# Patient Record
Sex: Female | Born: 1995 | Race: Black or African American | Hispanic: No | Marital: Single | Smoking: Former smoker
Health system: Southern US, Community
[De-identification: ages and names within clinical notes are randomized; demographics above are authoritative.]

---
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
## 2015-08-06 ENCOUNTER — Ambulatory Visit (INDEPENDENT_AMBULATORY_CARE_PROVIDER_SITE_OTHER): Payer: 59 | Admitting: Physician Assistant

## 2015-08-06 VITALS — BP 122/80 | HR 84 | Temp 98.2°F | Resp 16 | Ht 66.0 in | Wt 194.2 lb

## 2015-08-06 DIAGNOSIS — Z111 Encounter for screening for respiratory tuberculosis: Secondary | ICD-10-CM

## 2015-08-06 DIAGNOSIS — Z029 Encounter for administrative examinations, unspecified: Secondary | ICD-10-CM | POA: Diagnosis not present

## 2015-08-06 NOTE — Patient Instructions (Signed)
     IF you received an x-ray today, you will receive an invoice from [facility]. Please contact [facility] at [phone] with questions or concerns regarding your invoice.   IF you received labwork today, you will receive an invoice from [facility]. Please contact [facility] at [phone] with questions or concerns regarding your invoice.   Our billing staff will not be able to assist you with questions regarding bills from these companies.  You will be contacted with the lab results as soon as they are available. The fastest way to get your results is to activate your My Chart account. Instructions are located on the last page of this paperwork. If you have not heard from us regarding the results in 2 weeks, please contact this office.      

## 2015-08-06 NOTE — Progress Notes (Signed)

## 2015-08-06 NOTE — Progress Notes (Signed)
[facility] [address], [place] [address] [phone]  Date:  08/06/2015   Name:  Sara SimmeringDaeja A Waller   DOB:  12-May-1996   MRN:  [id number]  PCP:  No primary care provider on file.    Chief Complaint: Annual Exam   History of Present Illness:  This is a 20 y.o. female who is presenting to have adoption form filled out. She lives with her mother and grandmother who do foster care and are about to adopt one of the foster kids. 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 She has no significant PMH. No physical limitations. No mental health issues.  Needs to get ppd today. She has had before and never positive.  Review of Systems:  Review of Systems See HPI  There are no active problems to display for this patient.   Prior to Admission medications   Not on File    No Known Allergies  History reviewed. No pertinent past surgical history.  Social History  Substance Use Topics  . Smoking status: Former Games developermoker  . Smokeless tobacco: None  . Alcohol Use: None    No family history on file.  Medication list has been reviewed and updated.  Physical Examination:  Physical Exam  Constitutional: She is oriented to person, place, and time. She appears well-developed and well-nourished. No distress.  HENT:  Head: Normocephalic and atraumatic.  Right Ear: Hearing, tympanic membrane, external ear and ear canal normal.  Left Ear: Hearing, tympanic membrane, external ear and ear canal normal.  Nose: Nose normal.  Mouth/Throat: Uvula is midline, oropharynx is clear and moist and mucous membranes are normal.  Eyes: Conjunctivae and lids are normal. Right eye exhibits no discharge. Left eye exhibits no discharge. No scleral icterus.  Cardiovascular: Normal rate, regular rhythm, normal heart sounds and normal pulses.   No murmur heard. Pulmonary/Chest: Effort normal and breath sounds normal. No respiratory distress.  She has no wheezes. She has no rhonchi. She has no rales.  Musculoskeletal: Normal range of motion.  Lymphadenopathy:       Head (right side): No submental, no submandibular and no tonsillar adenopathy present.       Head (left side): No submental, no submandibular and no tonsillar adenopathy present.    She has no cervical adenopathy.  Neurological: She is alert and oriented to person, place, and time.  Skin: Skin is warm, dry and intact. No lesion and no rash noted.  Psychiatric: She has a normal mood and affect. Her speech is normal and behavior is normal. Thought content normal.   BP 122/80 mmHg  Pulse 84  Temp(Src) 98.2 F (36.8 C) (Oral)  Resp 16  Ht 5\' 6"  (1.676 m)  Wt 194 lb 3.2 oz (88.089 kg)  BMI 31.36 kg/m2  LMP 08/01/2015 (Approximate)  Assessment and Plan:  1. Encounter for administrative examinations 2. Screening-pulmonary TB Cleared from a medical standpoint. Forms pilled out. PPD placed. Return in 48-72 hours to have read. - TB Skin Test   Roswell MinersNicole V. Dyke BrackettBush, PA-C, MHS [facility]  08/06/2015

## 2015-08-09 ENCOUNTER — Ambulatory Visit (INDEPENDENT_AMBULATORY_CARE_PROVIDER_SITE_OTHER): Payer: 59 | Admitting: *Deleted

## 2015-08-09 DIAGNOSIS — Z111 Encounter for screening for respiratory tuberculosis: Secondary | ICD-10-CM

## 2015-08-09 LAB — TB SKIN TEST
Induration: 0 mm
TB Skin Test: NEGATIVE

## 2016-01-24 ENCOUNTER — Ambulatory Visit (INDEPENDENT_AMBULATORY_CARE_PROVIDER_SITE_OTHER): Payer: 59 | Admitting: Family Medicine

## 2016-01-24 ENCOUNTER — Other Ambulatory Visit: Payer: Self-pay | Admitting: Family Medicine

## 2016-01-24 ENCOUNTER — Ambulatory Visit (INDEPENDENT_AMBULATORY_CARE_PROVIDER_SITE_OTHER): Payer: 59

## 2016-01-24 DIAGNOSIS — S20212A Contusion of left front wall of thorax, initial encounter: Secondary | ICD-10-CM

## 2016-01-24 DIAGNOSIS — M62830 Muscle spasm of back: Secondary | ICD-10-CM

## 2016-01-24 DIAGNOSIS — S4992XA Unspecified injury of left shoulder and upper arm, initial encounter: Secondary | ICD-10-CM

## 2016-01-24 DIAGNOSIS — S79912A Unspecified injury of left hip, initial encounter: Secondary | ICD-10-CM

## 2016-01-24 DIAGNOSIS — M25562 Pain in left knee: Secondary | ICD-10-CM

## 2016-01-24 DIAGNOSIS — Z3202 Encounter for pregnancy test, result negative: Secondary | ICD-10-CM | POA: Diagnosis not present

## 2016-01-24 DIAGNOSIS — T148XXA Other injury of unspecified body region, initial encounter: Secondary | ICD-10-CM

## 2016-01-24 LAB — POCT URINE PREGNANCY: Preg Test, Ur: NEGATIVE

## 2016-01-24 MED ORDER — CYCLOBENZAPRINE HCL 10 MG PO TABS: 10.0000 mg | ORAL_TABLET | Freq: Every evening | ORAL | 0 refills | Status: DC | PRN
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
## 2016-01-24 NOTE — Patient Instructions (Addendum)
You have a muscle spasm of your back.  Also bruising on your arms and legs.    The rest of your xrays looked great.    We will treat with ibuprofen for inflammation and pain relief.  Also treat with flexeril  Follow up in 1 week if no improvement.  Follow up sooner if worsneing. Alternateheat and ice for relief. Keep staying active as much as possible to prevent pain and spasm.     IF you received an x-ray today, you will receive an invoice from [facility]. Please contact [facility] at [phone] with questions or concerns regarding your invoice.   IF you received labwork today, you will receive an invoice from [facility]. Please contact [facility] at [phone] with questions or concerns regarding your invoice.   Our billing staff will not be able to assist you with questions regarding bills from these companies.  You will be contacted with the lab results as soon as they are available. The fastest way to get your results is to activate your My Chart account. Instructions are located on the last page of this paperwork. If you have not heard from us regarding the results in 2 weeks, please contact this office.

## 2016-07-02 ENCOUNTER — Ambulatory Visit (INDEPENDENT_AMBULATORY_CARE_PROVIDER_SITE_OTHER): Payer: 59 | Admitting: Family Medicine

## 2016-07-02 VITALS — BP 118/74 | HR 105 | Temp 97.8°F | Resp 18 | Ht 66.0 in | Wt 195.4 lb

## 2016-07-02 DIAGNOSIS — R6889 Other general symptoms and signs: Secondary | ICD-10-CM

## 2016-07-02 MED ORDER — IPRATROPIUM BROMIDE 0.03 % NA SOLN: 2.0000 | Freq: Two times a day (BID) | NASAL | 0 refills | Status: DC

## 2016-07-02 MED ORDER — HYDROCOD POLST-CPM POLST ER 10-8 MG/5ML PO SUER: 5.0000 mL | Freq: Two times a day (BID) | ORAL | 0 refills | Status: DC | PRN

## 2016-07-02 MED ORDER — ALBUTEROL SULFATE HFA 108 (90 BASE) MCG/ACT IN AERS: 2.0000 | INHALATION_SPRAY | Freq: Four times a day (QID) | RESPIRATORY_TRACT | 2 refills | Status: AC | PRN

## 2016-07-02 NOTE — Progress Notes (Signed)
Patient ID: Sara Waller, female    DOB: November 11, 1995, 21 y.o.   MRN: [id number]  PCP: No PCP Per Patient  Chief Complaint  Patient presents with  . Fever    since monday  . Chills  . Chest Pain    Subjective:  HPI 21 year old female presents for evaluation of fever, chills, and chest pain x 3 days. Reports acute onset of symptoms of chills, fatigue, and non-productive cough initially with a subjective fever developing late Monday night. "My skin was hot to touch." Reports a coworker has been hospitalized with flu like symptoms so she is concerned for having the flu. She also reports a sore throat, intermittent  shortness of breath which is worst at night, one episode of vomiting x 2 days. Reports good fluid intake. Decrease appetite. Requests a few days out of work as she feel very fatigue and weak.    Social History   Social History  . Marital status: Single    Spouse name: N/A  . Number of children: N/A  . Years of education: N/A   Occupational History  . Not on file.   Social History Main Topics  . Smoking status: Former Games developermoker  . Smokeless tobacco: Never Used  . Alcohol use No  . Drug use: No  . Sexual activity: Not on file   Other Topics Concern  . Not on file   Social History Narrative  . No narrative on file   History reviewed. No pertinent family history. Review of Systems See HPI  Prior to Admission medications   Medication Sig Start Date End Date Taking? Authorizing Provider  cyclobenzaprine (FLEXERIL) 10 MG tablet Take 1 tablet (10 mg total) by mouth at bedtime as needed for muscle spasms. 01/24/16  Yes Tobey GrimJeffrey H Walden, MD    Past Medical, Surgical Family and Social History reviewed and updated.    Objective:   Today's Vitals   07/02/16 1601  BP: 118/74  Pulse: (!) 105  Resp: 18  Temp: 97.8 F (36.6 C)  TempSrc: Oral  SpO2: 99%  Weight: 195 lb 6.4 oz (88.6 kg)  Height: 5\' 6"  (1.676 m)    Wt Readings from Last 3 Encounters:  07/02/16  195 lb 6.4 oz (88.6 kg)  01/24/16 198 lb (89.8 kg)  08/06/15 194 lb 3.2 oz (88.1 kg)    Physical Exam  Constitutional: She is oriented to person, place, and time. She appears well-developed and well-nourished.  HENT:  Head: Normocephalic and atraumatic.  Right Ear: Hearing, tympanic membrane, external ear and ear canal normal.  Left Ear: Hearing, tympanic membrane, external ear and ear canal normal.  Nose: Rhinorrhea present. No mucosal edema.  Mouth/Throat: Uvula is midline and oropharynx is clear and moist.  Cardiovascular: Regular rhythm, normal heart sounds and intact distal pulses.   Tachycardic   Pulmonary/Chest: Effort normal and breath sounds normal.  Neurological: She is alert and oriented to person, place, and time.  Skin: Skin is warm and dry.  Psychiatric: She has a normal mood and affect. Her behavior is normal. Judgment normal.       Assessment & Plan:  1. Flu-like symptoms You likely had influenza and should be improving at this point.  Plan: For cough, chlorpheniramine-hydrocodone (Tussionex) 5 ml every 12 hours. Medication causes drowsiness and or sedation.    Ipratropium (Atrovent) 2 sprays, twice daily as needed for nasal congestion .  Albuterol inhaler, 2 puffs every 4-6 hours as needed for shortness of breath.  Return  for care if symptoms worsen or do not improve.   Godfrey Pick. Tiburcio Pea, MSN, FNP-C [facility] at [facility] [phone]

## 2016-07-02 NOTE — Patient Instructions (Addendum)
You likely had influenza and should be improving at this point.   For cough, chlorpheniramine-hydrocodone (Tussionex) 5 ml every 12 hours. Medication causes drowsiness and or sedation.    Ipratropium (Atrovent) 2 sprays, twice daily as needed for nasal congestion .  Albuterol inhaler, 2 puffs every 4-6 hours as needed for shortness of breath.  Return for care if symptoms worsen or do not improve.  IF you received an x-ray today, you will receive an invoice from [facility]. Please contact [facility] at [phone] with questions or concerns regarding your invoice.   IF you received labwork today, you will receive an invoice from [place]. Please contact [facility] at [phone] with questions or concerns regarding your invoice.   Our billing staff will not be able to assist you with questions regarding bills from these companies.  You will be contacted with the lab results as soon as they are available. The fastest way to get your results is to activate your My Chart account. Instructions are located on the last page of this paperwork. If you have not heard from [place] regarding the results in 2 weeks, please contact this office.      Influenza, Adult Influenza ("the flu") is an infection in the lungs, nose, and throat (respiratory tract). It is caused by a virus. The flu causes many common cold symptoms, as well as a high fever and body aches. It can make you feel very sick. The flu spreads easily from person to person (is contagious). Getting a flu shot (influenza vaccination) every year is the best way to prevent the flu. Follow these instructions at home:  Take over-the-counter and prescription medicines only as told by your doctor.  Use a cool mist humidifier to add moisture (humidity) to the air in your home. This can make it easier to breathe.  Rest as needed.  Drink enough fluid to keep your pee (urine) clear or pale yellow.  Cover your mouth and nose when  you cough or sneeze.  Wash your hands with soap and water often, especially after you cough or sneeze. If you cannot use soap and water, use hand sanitizer.  Stay home from work or school as told by your doctor. Unless you are visiting your doctor, try to avoid leaving home until your fever has been gone for 24 hours without the use of medicine.  Keep all follow-up visits as told by your doctor. This is important. How is this prevented?  Getting a yearly (annual) flu shot is the best way to avoid getting the flu. You may get the flu shot in late summer, fall, or winter. Ask your doctor when you should get your flu shot.  Wash your hands often or use hand sanitizer often.  Avoid contact with people who are sick during cold and flu season.  Eat healthy foods.  Drink plenty of fluids.  Get enough sleep.  Exercise regularly. Contact a doctor if:  You get new symptoms.  You have:  Chest pain.  Watery poop (diarrhea).  A fever.  Your cough gets worse.  You start to have more mucus.  You feel sick to your stomach (nauseous).  You throw up (vomit). Get help right away if:  You start to be short of breath or have trouble breathing.  Your skin or nails turn a bluish color.  You have very bad pain or stiffness in your neck.  You get a sudden headache.  You get sudden pain in your face or ear.  You  cannot stop throwing up. This information is not intended to replace advice given to you by your health care provider. Make sure you discuss any questions you have with your health care provider. Document Released: 02/05/2008 Document Revised: 10/04/2015 Document Reviewed: 02/20/2015 Elsevier Interactive Patient Education  2017 [employer]
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
## 2020-02-07 ENCOUNTER — Ambulatory Visit: Payer: Self-pay | Attending: Internal Medicine

## 2020-02-07 DIAGNOSIS — Z23 Encounter for immunization: Secondary | ICD-10-CM

## 2020-02-07 NOTE — Progress Notes (Signed)
   Covid-19 Vaccination Clinic  Name:  Sara Waller    MRN: [id number] DOB: [birth year]  02/07/2020  [name] was observed post Covid-19 immunization for 15 minutes without incident. She was provided with Vaccine Information Sheet and instruction to access the V-Safe system.   [name] was instructed to call 911 with any severe reactions post vaccine: Marland Kitchen Difficulty breathing  . Swelling of face and throat  . A fast heartbeat  . A bad rash all over body  . Dizziness and weakness   Immunizations Administered    Name Date Dose VIS Date Route   Pfizer COVID-19 Vaccine 02/07/2020 12:17 PM 0.3 mL 07/06/2018 Intramuscular   Manufacturer: [employer], [employer]   Lot: [id number]   NDC: [address]-4

## 2020-02-28 ENCOUNTER — Ambulatory Visit: Payer: Self-pay | Attending: Internal Medicine

## 2020-02-28 DIAGNOSIS — Z23 Encounter for immunization: Secondary | ICD-10-CM

## 2020-02-28 NOTE — Progress Notes (Signed)
   Covid-19 Vaccination Clinic  Name:  Sara Waller    MRN: [id number] DOB: Oct 01, 1995  02/28/2020  Sara Waller was observed post Covid-19 immunization for 15 minutes without incident. She was provided with Vaccine Information Sheet and instruction to access the V-Safe system.   Sara Waller was instructed to call 911 with any severe reactions post vaccine: Marland Kitchen Difficulty breathing  . Swelling of face and throat  . A fast heartbeat  . A bad rash all over body  . Dizziness and weakness   Immunizations Administered    Name Date Dose VIS Date Route   Pfizer COVID-19 Vaccine 02/28/2020  9:19 AM 0.3 mL 07/06/2018 Intramuscular   Manufacturer: [employer], [employer]   Lot: [id number]   NDC: [address]-5

## 2020-09-03 ENCOUNTER — Other Ambulatory Visit: Payer: Self-pay

## 2020-09-03 ENCOUNTER — Ambulatory Visit (HOSPITAL_COMMUNITY)
Admission: EM | Admit: 2020-09-03 | Discharge: 2020-09-03 | Disposition: A | Discharge status: Home / Self Care | Payer: BC Managed Care – PPO

## 2020-09-03 ENCOUNTER — Encounter (HOSPITAL_COMMUNITY): Payer: Self-pay

## 2020-09-03 DIAGNOSIS — J302 Other seasonal allergic rhinitis: Secondary | ICD-10-CM

## 2020-09-03 NOTE — ED Triage Notes (Addendum)
Pt in with c/o eye puffiness, throat itchiness and chest congestion that started a few weeks ago that have resolved  Pt states she has been taking benadryl, vicks vapor rub for sxs relief  Pt states she needs note for work

## 2020-09-03 NOTE — Discharge Instructions (Addendum)
You may return to work.      You can take Tylenol and/or Ibuprofen as needed for fever reduction and pain relief.   For sore throat: try warm salt water gargles, cepacol lozenges, throat spray, warm tea or water with lemon/honey, popsicles or ice, or OTC cold relief medicine for throat discomfort.    For congestion: take a daily anti-histamine like Zyrtec, Claritin, and a oral decongestant to help with post nasal drip that may be irritating your throat. You can also use Flonase 2 sprays in each nostril daily.    It is important to stay hydrated: drink plenty of fluids (water, gatorade/powerade/pedialyte, juices, or teas) to keep your throat moisturized and help further relieve irritation/discomfort.   Return or go to the Emergency Department if symptoms worsen or do not improve in the next few days.

## 2020-09-03 NOTE — ED Provider Notes (Signed)
[facility]    CSN: [id number] Arrival date & time: 09/03/20  1352      History   Chief Complaint Chief Complaint  Patient presents with  . Allergies    HPI Sara Waller is a 25 y.o. female.   Patient here for evaluation of chest congestion, facial and eye swelling and itching, and sore throat.  Reports being outside all day on Friday and developed symptoms.  Reports taking benadryl and using vicks for symptom relief.  Reports symptoms have resolved at this time but states that she needs a note stating it is ok to return to work.  Denies any trauma, injury, or other precipitating event. Denies any fevers, chest pain, shortness of breath, N/V/D, numbness, tingling, weakness, abdominal pain, or headaches.   ROS: As per HPI, all other pertinent ROS negative   The history is provided by the patient.    History reviewed. No pertinent past medical history.  There are no problems to display for this patient.   History reviewed. No pertinent surgical history.  OB History   No obstetric history on file.      Home Medications    Prior to Admission medications   Medication Sig Start Date End Date Taking? Authorizing Provider  albuterol (PROVENTIL HFA;VENTOLIN HFA) 108 (90 Base) MCG/ACT inhaler Inhale 2 puffs into the lungs every 6 (six) hours as needed for wheezing or shortness of breath. 07/02/16   Bing Neighbors, FNP  chlorpheniramine-HYDROcodone (TUSSIONEX PENNKINETIC ER) 10-8 MG/5ML SUER Take 5 mLs by mouth every 12 (twelve) hours as needed for cough. 07/02/16   Bing Neighbors, FNP  cyclobenzaprine (FLEXERIL) 10 MG tablet Take 1 tablet (10 mg total) by mouth at bedtime as needed for muscle spasms. 01/24/16   Tobey Grim, MD  ipratropium (ATROVENT) 0.03 % nasal spray Place 2 sprays into both nostrils 2 (two) times daily. 07/02/16   Bing Neighbors, FNP    Family History Family History  Problem Relation Age of Onset  . Healthy Mother     Social  History Social History   Tobacco Use  . Smoking status: Former Games developer  . Smokeless tobacco: Never Used  Substance Use Topics  . Alcohol use: No    Alcohol/week: 0.0 standard drinks  . Drug use: No     Allergies   Patient has no known allergies.   Review of Systems Review of Systems  All other systems reviewed and are negative.    Physical Exam Triage Vital Signs ED Triage Vitals  Enc Vitals Group     BP 09/03/20 1501 130/85     Pulse Rate 09/03/20 1501 91     Resp 09/03/20 1501 18     Temp 09/03/20 1501 98.7 F (37.1 C)     Temp src --      SpO2 09/03/20 1501 100 %     Weight --      Height --      Head Circumference --      Peak Flow --      Pain Score 09/03/20 1459 0     Pain Loc --      Pain Edu? --      Excl. in GC? --    No data found.  Updated Vital Signs BP 130/85   Pulse 91   Temp 98.7 F (37.1 C)   Resp 18   LMP 08/24/2020 (Exact Date)   SpO2 100%   Visual Acuity Right Eye Distance:   Left  Eye Distance:   Bilateral Distance:    Right Eye Near:   Left Eye Near:    Bilateral Near:     Physical Exam Vitals and nursing note reviewed.  Constitutional:      General: She is not in acute distress.    Appearance: Normal appearance. She is not ill-appearing, toxic-appearing or diaphoretic.  HENT:     Head: Normocephalic and atraumatic.     Nose: Nose normal.     Mouth/Throat:     Mouth: Mucous membranes are moist.     Pharynx: Oropharynx is clear.  Eyes:     Conjunctiva/sclera: Conjunctivae normal.  Cardiovascular:     Rate and Rhythm: Normal rate.     Pulses: Normal pulses.  Pulmonary:     Effort: Pulmonary effort is normal.  Abdominal:     General: Abdomen is flat.  Musculoskeletal:        General: Normal range of motion.     Cervical back: Normal range of motion.  Skin:    General: Skin is warm and dry.  Neurological:     General: No focal deficit present.     Mental Status: She is alert and oriented to person, place, and  time.  Psychiatric:        Mood and Affect: Mood normal.      UC Treatments / Results  Labs (all labs ordered are listed, but only abnormal results are displayed) Labs Reviewed - No data to display  EKG   Radiology No results found.  Procedures Procedures (including critical care time)  Medications Ordered in UC Medications - No data to display  Initial Impression / Assessment and Plan / UC Course  I have reviewed the triage vital signs and the nursing notes.  Pertinent labs & imaging results that were available during my care of the patient were reviewed by me and considered in my medical decision making (see chart for details).     Has been negative for red flags or concerns.  Most likely seasonal allergies.  Patient is cleared to go back to work.  Recommend continuing to take allergy medications as needed.  Work note given to patient.  Final Clinical Impressions(s) / UC Diagnoses   Final diagnoses:  Seasonal allergies     Discharge Instructions     You may return to work.      You can take Tylenol and/or Ibuprofen as needed for fever reduction and pain relief.   For sore throat: try warm salt water gargles, cepacol lozenges, throat spray, warm tea or water with lemon/honey, popsicles or ice, or OTC cold relief medicine for throat discomfort.    For congestion: take a daily anti-histamine like Zyrtec, Claritin, and a oral decongestant to help with post nasal drip that may be irritating your throat. You can also use Flonase 2 sprays in each nostril daily.    It is important to stay hydrated: drink plenty of fluids (water, gatorade/powerade/pedialyte, juices, or teas) to keep your throat moisturized and help further relieve irritation/discomfort.   Return or go to the Emergency Department if symptoms worsen or do not improve in the next few days.      ED Prescriptions    None     PDMP not reviewed this encounter.   Ivette Loyal, NP 09/03/20 1539

## 2023-08-19 ENCOUNTER — Other Ambulatory Visit: Payer: Self-pay

## 2023-08-19 ENCOUNTER — Ambulatory Visit
Admission: RE | Admit: 2023-08-19 | Discharge: 2023-08-19 | Disposition: A | Discharge status: Home / Self Care | Source: Ambulatory Visit | Attending: Nurse Practitioner | Admitting: Nurse Practitioner

## 2023-08-19 VITALS — BP 132/89 | HR 85 | Temp 98.4°F | Resp 20

## 2023-08-19 DIAGNOSIS — Z3202 Encounter for pregnancy test, result negative: Secondary | ICD-10-CM

## 2023-08-19 LAB — POCT URINE PREGNANCY: Preg Test, Ur: NEGATIVE

## 2023-08-19 NOTE — ED Triage Notes (Signed)
 Pt reports LMP 3/16. Pt reports started period for April within last few days. Pt inquiring about possible pregnancy. Last unprotected intercourse 3/30.

## 2023-08-19 NOTE — ED Provider Notes (Signed)
 [facility]    CSN: [id number] Arrival date & time: 08/19/23  0951      History   Chief Complaint Chief Complaint  Patient presents with   Possible Pregnancy    HPI Sara Waller is a 28 y.o. female.   Patient presents today for concern for pregnancy.  She is requesting a pregnancy test today.  Reports 3 at home pregnancy tests have been negative.  She reports LMP 07/26/2023 and was normal.  Menses began again 2 days ago and this is abnormal for her.  She denies any other abnormal vaginal discharge.  She is sexually active, does not use condoms with every sexual encounter.  No pelvic or abdominal pain, nausea/vomiting, groin swelling, vaginal rashes, sores, or lesions.  She declines STI testing today.    History reviewed. No pertinent past medical history.  There are no active problems to display for this patient.   History reviewed. No pertinent surgical history.  OB History   No obstetric history on file.      Home Medications    Prior to Admission medications   Medication Sig Start Date End Date Taking? Authorizing Provider  albuterol (PROVENTIL HFA;VENTOLIN HFA) 108 (90 Base) MCG/ACT inhaler Inhale 2 puffs into the lungs every 6 (six) hours as needed for wheezing or shortness of breath. 07/02/16   Bing Neighbors, NP  chlorpheniramine-HYDROcodone (TUSSIONEX PENNKINETIC ER) 10-8 MG/5ML SUER Take 5 mLs by mouth every 12 (twelve) hours as needed for cough. 07/02/16   Bing Neighbors, NP  cyclobenzaprine (FLEXERIL) 10 MG tablet Take 1 tablet (10 mg total) by mouth at bedtime as needed for muscle spasms. 01/24/16   Tobey Grim, MD  ipratropium (ATROVENT) 0.03 % nasal spray Place 2 sprays into both nostrils 2 (two) times daily. 07/02/16   Bing Neighbors, NP    Family History Family History  Problem Relation Age of Onset   Healthy Mother     Social History Social History   Tobacco Use   Smoking status: Former   Smokeless tobacco: Never   Substance Use Topics   Alcohol use: Yes    Comment: occ   Drug use: Yes    Types: Marijuana    Comment: daily     Allergies   Patient has no known allergies.   Review of Systems Review of Systems Per HPI  Physical Exam Triage Vital Signs ED Triage Vitals  Encounter Vitals Group     BP 08/19/23 1014 132/89     Systolic BP Percentile --      Diastolic BP Percentile --      Pulse Rate 08/19/23 1014 85     Resp 08/19/23 1014 20     Temp 08/19/23 1014 98.4 F (36.9 C)     Temp Source 08/19/23 1014 Oral     SpO2 08/19/23 1014 99 %     Weight --      Height --      Head Circumference --      Peak Flow --      Pain Score 08/19/23 1011 0     Pain Loc --      Pain Education --      Exclude from Growth Chart --    No data found.  Updated Vital Signs BP 132/89 (BP Location: Right Arm)   Pulse 85   Temp 98.4 F (36.9 C) (Oral)   Resp 20   LMP 08/18/2023 (Approximate)   SpO2 99%   Visual Acuity  Right Eye Distance:   Left Eye Distance:   Bilateral Distance:    Right Eye Near:   Left Eye Near:    Bilateral Near:     Physical Exam Vitals and nursing note reviewed.  Constitutional:      General: She is not in acute distress.    Appearance: Normal appearance. She is not toxic-appearing.  Pulmonary:     Effort: Pulmonary effort is normal. No respiratory distress.  Skin:    General: Skin is warm and dry.     Coloration: Skin is not jaundiced or pale.     Findings: No erythema.  Neurological:     Mental Status: She is alert and oriented to person, place, and time.     Motor: No weakness.     Gait: Gait normal.  Psychiatric:        Mood and Affect: Mood normal.        Behavior: Behavior is cooperative.      UC Treatments / Results  Labs (all labs ordered are listed, but only abnormal results are displayed) Labs Reviewed  POCT URINE PREGNANCY    EKG   Radiology No results found.  Procedures Procedures (including critical care  time)  Medications Ordered in UC Medications - No data to display  Initial Impression / Assessment and Plan / UC Course  I have reviewed the triage vital signs and the nursing notes.  Pertinent labs & imaging results that were available during my care of the patient were reviewed by me and considered in my medical decision making (see chart for details).   Patient is well-appearing, normotensive, afebrile, not tachycardic, not tachypneic, oxygenating well on room air.    1. Urine pregnancy test negative We discussed negative urine pregnancy test result Etiology abnormal uterine bleeding likely multifactorial; offered further workup, however patient declines Return and ER precautions discussed with patient Safe sex practices discussed and condoms given today  The patient was given the opportunity to ask questions.  All questions answered to their satisfaction.  The patient is in agreement to this plan.   Final Clinical Impressions(s) / UC Diagnoses   Final diagnoses:  Urine pregnancy test negative     Discharge Instructions      The urine pregnancy test today is negative.  Recommend condom use with every sexual encounter to both prevent STI and prevent unwanted pregnancy.   ED Prescriptions   None    PDMP not reviewed this encounter.   Valentino Nose, NP 08/19/23 1140

## 2023-08-19 NOTE — Discharge Instructions (Addendum)
 The urine pregnancy test today is negative.  Recommend condom use with every sexual encounter to both prevent STI and prevent unwanted pregnancy.

## 2023-08-19 NOTE — ED Notes (Addendum)
 Unable to provide sample at this time. Pt provided water.

## 2023-10-22 ENCOUNTER — Ambulatory Visit (HOSPITAL_COMMUNITY)
Admission: RE | Admit: 2023-10-22 | Discharge: 2023-10-22 | Disposition: A | Discharge status: Home / Self Care | Source: Ambulatory Visit | Attending: Family Medicine | Admitting: Family Medicine

## 2023-10-22 ENCOUNTER — Encounter (HOSPITAL_COMMUNITY): Payer: Self-pay

## 2023-10-22 ENCOUNTER — Telehealth (HOSPITAL_COMMUNITY): Payer: Self-pay | Admitting: *Deleted

## 2023-10-22 VITALS — BP 154/89 | HR 97 | Temp 98.1°F | Resp 16

## 2023-10-22 DIAGNOSIS — R102 Pelvic and perineal pain: Secondary | ICD-10-CM | POA: Insufficient documentation

## 2023-10-22 DIAGNOSIS — N911 Secondary amenorrhea: Secondary | ICD-10-CM | POA: Insufficient documentation

## 2023-10-22 DIAGNOSIS — Z3202 Encounter for pregnancy test, result negative: Secondary | ICD-10-CM | POA: Diagnosis not present

## 2023-10-22 LAB — TSH: TSH: 2.151 u[IU]/mL (ref 0.350–4.500)

## 2023-10-22 LAB — POCT URINE PREGNANCY: Preg Test, Ur: NEGATIVE

## 2023-10-22 LAB — HCG, QUANTITATIVE, PREGNANCY: hCG, Beta Chain, Quant, S: 1 m[IU]/mL (ref <5)

## 2023-10-22 NOTE — Discharge Instructions (Signed)
 The pregnancy test was negative  Staff will notify you if there is anything positive on the swab or anything significantly abnormal on the blood work--the blood work will check a serum or blood test for pregnancy, thyroid function, and prolactin levels.

## 2023-10-22 NOTE — ED Provider Notes (Signed)
 [facility]    CSN: [id number] Arrival date & time: 10/22/23  0844      History   Chief Complaint Chief Complaint  Patient presents with   Amenorrhea   Appt 0930    HPI Sara Waller is a 28 y.o. female.   HPI Here for missed period.  Last menstrual cycle was May 5.  When her menstrual cycle was the come in June she started having some cramping in her lower abdomen on both sides.  It is mild and intermittent.  She has done pregnancy test at home and they are negative.  She does not have any nausea or vaginal discharge or itching.  No headache and no fatigue. No dysuria  NKDA   History reviewed. No pertinent past medical history.  There are no active problems to display for this patient.   History reviewed. No pertinent surgical history.  OB History   No obstetric history on file.      Home Medications    Prior to Admission medications   Medication Sig Start Date End Date Taking? Authorizing Provider  albuterol  (PROVENTIL  HFA;VENTOLIN  HFA) 108 (90 Base) MCG/ACT inhaler Inhale 2 puffs into the lungs every 6 (six) hours as needed for wheezing or shortness of breath. 07/02/16   Buena Carmine, NP  chlorpheniramine-HYDROcodone (TUSSIONEX PENNKINETIC ER) 10-8 MG/5ML SUER Take 5 mLs by mouth every 12 (twelve) hours as needed for cough. 07/02/16   Buena Carmine, NP  cyclobenzaprine  (FLEXERIL ) 10 MG tablet Take 1 tablet (10 mg total) by mouth at bedtime as needed for muscle spasms. 01/24/16   Trenton Frock, MD  ipratropium (ATROVENT ) 0.03 % nasal spray Place 2 sprays into both nostrils 2 (two) times daily. 07/02/16   Buena Carmine, NP    Family History Family History  Problem Relation Age of Onset   Healthy Mother     Social History Social History   Tobacco Use   Smoking status: Former    Types: Cigarettes   Smokeless tobacco: Never  Vaping Use   Vaping status: Never Used  Substance Use Topics   Alcohol use: Yes    Comment:  occasionally   Drug use: Yes    Types: Marijuana     Allergies   Patient has no known allergies.   Review of Systems Review of Systems   Physical Exam Triage Vital Signs ED Triage Vitals  Encounter Vitals Group     BP 10/22/23 0929 (!) 154/89     Girls Systolic BP Percentile --      Girls Diastolic BP Percentile --      Boys Systolic BP Percentile --      Boys Diastolic BP Percentile --      Pulse Rate 10/22/23 0929 97     Resp 10/22/23 0929 16     Temp 10/22/23 0929 98.1 F (36.7 C)     Temp Source 10/22/23 0929 Oral     SpO2 10/22/23 0929 99 %     Weight --      Height --      Head Circumference --      Peak Flow --      Pain Score 10/22/23 0931 0     Pain Loc --      Pain Education --      Exclude from Growth Chart --    No data found.  Updated Vital Signs BP (!) 154/89   Pulse 97   Temp 98.1 F (36.7 C) (Oral)  Resp 16   LMP 09/14/2023 (Exact Date)   SpO2 99%   Visual Acuity Right Eye Distance:   Left Eye Distance:   Bilateral Distance:    Right Eye Near:   Left Eye Near:    Bilateral Near:     Physical Exam Vitals reviewed.  Constitutional:      General: She is not in acute distress.    Appearance: She is not ill-appearing, toxic-appearing or diaphoretic.  HENT:     Mouth/Throat:     Mouth: Mucous membranes are moist.   Eyes:     Extraocular Movements: Extraocular movements intact.     Conjunctiva/sclera: Conjunctivae normal.     Pupils: Pupils are equal, round, and reactive to light.    Cardiovascular:     Rate and Rhythm: Normal rate and regular rhythm.     Heart sounds: No murmur heard. Pulmonary:     Effort: Pulmonary effort is normal.     Breath sounds: Normal breath sounds.  Abdominal:     General: There is no distension.     Palpations: Abdomen is soft. There is no mass.     Tenderness: There is no abdominal tenderness. There is no guarding.   Musculoskeletal:     Cervical back: Neck supple.  Lymphadenopathy:      Cervical: No cervical adenopathy.   Skin:    Coloration: Skin is not pale.   Neurological:     General: No focal deficit present.     Mental Status: She is alert and oriented to person, place, and time.   Psychiatric:        Behavior: Behavior normal.      UC Treatments / Results  Labs (all labs ordered are listed, but only abnormal results are displayed) Labs Reviewed  HCG, QUANTITATIVE, PREGNANCY  TSH  PROLACTIN  POCT URINE PREGNANCY  CERVICOVAGINAL ANCILLARY ONLY    EKG   Radiology No results found.  Procedures Procedures (including critical care time)  Medications Ordered in UC Medications - No data to display  Initial Impression / Assessment and Plan / UC Course  I have reviewed the triage vital signs and the nursing notes.  Pertinent labs & imaging results that were available during my care of the patient were reviewed by me and considered in my medical decision making (see chart for details).     UPT is negative here.  Vaginal self swab is done, and we will notify of any positives on that and treat per protocol.  Blood is drawn to check serum hCG, TSH, and prolactin.  Will notify her of anything significantly abnormal on those blood tests   Final Clinical Impressions(s) / UC Diagnoses   Final diagnoses:  Secondary amenorrhea  Pelvic pain     Discharge Instructions      The pregnancy test was negative  Staff will notify you if there is anything positive on the swab or anything significantly abnormal on the blood work--the blood work will check a serum or blood test for pregnancy, thyroid function, and prolactin levels.      ED Prescriptions   None    PDMP not reviewed this encounter.   Ann Keto, MD 10/22/23 1011

## 2023-10-22 NOTE — ED Triage Notes (Signed)
 Pt reports having 3 negative home pregnancy tests, but she is approx 4 days past when her cycle was due. Wishes to have pregnancy test.

## 2023-10-22 NOTE — Telephone Encounter (Signed)
 Pt requesting clarification of quant HCG results. Discussed results with Dr Ellsworth Haas. Informed pt that according to this lab's reference range it does not indicate a pregnancy, but Dr Ellsworth Haas recommends she f/u with GYN to have level redrawn in a few days to a week from now just to be sure. Pt states she does not have a GYN, and would prefer to not go through her PCP office. Informed pt that she is welcome to come back to [facility] and be seen again, and request quant HCG levels again. Pt verbalized understanding.

## 2023-10-23 ENCOUNTER — Ambulatory Visit (HOSPITAL_COMMUNITY): Payer: Self-pay

## 2023-10-23 LAB — CERVICOVAGINAL ANCILLARY ONLY
Chlamydia: NEGATIVE
Comment: NEGATIVE
Comment: NEGATIVE
Comment: NORMAL
Neisseria Gonorrhea: NEGATIVE
Trichomonas: NEGATIVE

## 2023-10-24 LAB — PROLACTIN: Prolactin: 11.4 ng/mL (ref 4.8–33.4)

## 2024-03-23 ENCOUNTER — Encounter (HOSPITAL_COMMUNITY): Payer: Self-pay

## 2024-03-23 ENCOUNTER — Ambulatory Visit (HOSPITAL_COMMUNITY)
Admission: RE | Admit: 2024-03-23 | Discharge: 2024-03-23 | Disposition: A | Discharge status: Home / Self Care | Source: Ambulatory Visit | Attending: Internal Medicine | Admitting: Internal Medicine

## 2024-03-23 VITALS — BP 139/87 | HR 76 | Temp 98.1°F | Resp 17

## 2024-03-23 DIAGNOSIS — Z113 Encounter for screening for infections with a predominantly sexual mode of transmission: Secondary | ICD-10-CM | POA: Diagnosis not present

## 2024-03-23 DIAGNOSIS — N946 Dysmenorrhea, unspecified: Secondary | ICD-10-CM | POA: Insufficient documentation

## 2024-03-23 LAB — HIV ANTIBODY (ROUTINE TESTING W REFLEX): HIV Screen 4th Generation wRfx: NONREACTIVE

## 2024-03-23 MED ORDER — IBUPROFEN 200 MG PO TABS: 600.0000 mg | ORAL_TABLET | Freq: Four times a day (QID) | ORAL | 0 refills | Status: DC | PRN

## 2024-03-23 NOTE — Discharge Instructions (Addendum)
 Take 3 pills of ibuprofen (600mg ) every 6 hours as needed for menstrual cramping.  You may take ibuprofen in addition to your normal tylenol that you are already taking. Tylenol 1,000mg  every 6 hours as needed for menstrual cramping.   STD testing pending, this will take 2-3 days to result. We will only call you if your testing is positive for any infection(s) and we will provide treatment.  Avoid sexual intercourse until your STD results come back.  If any of your STD results are positive, you will need to avoid sexual intercourse for 7 days while you are being treated to prevent spread of STD.  Condom use is the best way to prevent spread of STDs. Notify partner(s) of any positive results.  Return to urgent care as needed.

## 2024-03-23 NOTE — ED Triage Notes (Signed)
 Pt reports that she had made an appt for today due to her menstrual cycle being late and had negative home pregnancy tests. Pt reports that she started her cycle yesterday. Pt reports that she has abdominal cramping with her menstrual cycle that is worse the first couple days.

## 2024-03-23 NOTE — Medical Student Note (Signed)
 [facility] Insurance Account Manager Note For educational purposes for Medical, PA and NP students only and not part of the legal medical record.   CSN: [id number] Arrival date & time: 03/23/24  0830      History   Chief Complaint Chief Complaint  Patient presents with   Appointment    HPI Sara Waller is a 28 y.o. female.  Pt has a non-contributory medical history presents with pregnancy concerns due to a late menstrual cycle. She does not track her menstrual cycle well, but believes her LMP was the week of 10/6. At the time she made the appointment she had not had her menstrual cycle and she had 4 negative home pregnancy test. Last night she started her cycle and today she reports pelvic cramping that is consistent with her usual cycle. She takes APAP for her cramping without relief and she will occasionally take her mother's muscle relaxer. She has had 2 sexual partners in the last 3 months. She uses condoms with one partner, but not the other. She would like STI testing today.   The history is provided by the patient.    History reviewed. No pertinent past medical history.  There are no active problems to display for this patient.   History reviewed. No pertinent surgical history.  OB History   No obstetric history on file.      Home Medications    Prior to Admission medications   Medication Sig Start Date End Date Taking? Authorizing Provider  albuterol  (PROVENTIL  HFA;VENTOLIN  HFA) 108 (90 Base) MCG/ACT inhaler Inhale 2 puffs into the lungs every 6 (six) hours as needed for wheezing or shortness of breath. 07/02/16   Arloa Suzen RAMAN, NP    Family History Family History  Problem Relation Age of Onset   Healthy Mother     Social History Social History   Tobacco Use   Smoking status: Former    Types: Cigarettes   Smokeless tobacco: Never  Vaping Use   Vaping status: Never Used  Substance Use Topics   Alcohol use: Yes    Comment: occasionally    Drug use: Yes    Types: Marijuana     Allergies   Patient has no known allergies.   Review of Systems Review of Systems  Constitutional:  Negative for chills and fever.  Gastrointestinal:  Negative for abdominal pain.  Genitourinary:  Positive for pelvic pain (cramping) and vaginal bleeding.     Physical Exam Updated Vital Signs BP 139/87 (BP Location: Left Arm)   Pulse 76   Temp 98.1 F (36.7 C) (Oral)   Resp 17   LMP 03/22/2024 (Exact Date)   SpO2 99%   Physical Exam Constitutional:      Appearance: Normal appearance. She is normal weight.  Cardiovascular:     Rate and Rhythm: Normal rate and regular rhythm.     Heart sounds: Normal heart sounds. No murmur heard.    No friction rub. No gallop.  Pulmonary:     Effort: Pulmonary effort is normal.     Breath sounds: Normal breath sounds.  Abdominal:     Palpations: Abdomen is soft.     Tenderness: There is no abdominal tenderness.  Neurological:     Mental Status: She is alert and oriented to person, place, and time.  Psychiatric:        Mood and Affect: Mood normal.        Behavior: Behavior normal.      ED Treatments /  Results  Labs (all labs ordered are listed, but only abnormal results are displayed) Labs Reviewed - No data to display  EKG  Radiology No results found.  Procedures Procedures (including critical care time)  Medications Ordered in ED Medications - No data to display   Initial Impression / Assessment and Plan / ED Course  I have reviewed the triage vital signs and the nursing notes.  Pertinent labs & imaging results that were available during my care of the patient were reviewed by me and considered in my medical decision making (see chart for details).    Cervicovaginal cytology, HIV, and RPR are pending. Will update plan of care as needed.   Encounter for pregnancy testing and STI testing. Given start of menstrual cycle and multiple negative home pregangcy testing I am  reassured and additional testing is not needed. Will go ahead and obtain STI testing today and will contact the patient if testing indicates need for treatment. Counseled patient on heating pads, medication use, and 600 mg of ibuprofen PRN to help with menstrual cramping. Red flag symptoms reviewed and return precautions given.     Final Clinical Impressions(s) / ED Diagnoses   Final diagnoses:  None    New Prescriptions New Prescriptions   No medications on file

## 2024-03-23 NOTE — ED Provider Notes (Signed)
 [facility]    CSN: [id number] Arrival date & time: 03/23/24  0830      History   Chief Complaint Chief Complaint  Patient presents with   Appointment    HPI Sara Waller is a 28 y.o. female.   Sara Waller is a 28 y.o. female presenting for chief complaint of abdominal cramping associated with menstrual cycle that started yesterday. She initially made appointment due to concerns for late menstrual cycle. She took 4 pregnancy tests at home yesterday all of which were negative. She then started her menstrual cycle last night. Abdominal cramping feels like her normal cramping associated with menstrual cycle. She is taking tylenol for pain with relief. She denies nausea, vomiting, diarrhea, dizziness, urinary symptoms, and vaginal symptoms. Sexually active with 2 female partners in the last month, one with protection and one without. She does not use any forms of contraception. Denies known exposures to STDs, she is agreeable to STD testing today.      History reviewed. No pertinent past medical history.  There are no active problems to display for this patient.   History reviewed. No pertinent surgical history.  OB History   No obstetric history on file.      Home Medications    Prior to Admission medications   Medication Sig Start Date End Date Taking? Authorizing Provider  ibuprofen (ADVIL) 200 MG tablet Take 3 tablets (600 mg total) by mouth every 6 (six) hours as needed. 03/23/24  Yes Enedelia Dorna HERO, FNP  albuterol  (PROVENTIL  HFA;VENTOLIN  HFA) 108 (90 Base) MCG/ACT inhaler Inhale 2 puffs into the lungs every 6 (six) hours as needed for wheezing or shortness of breath. 07/02/16   Arloa Suzen RAMAN, NP    Family History Family History  Problem Relation Age of Onset   Healthy Mother     Social History Social History   Tobacco Use   Smoking status: Former    Types: Cigarettes   Smokeless tobacco: Never  Vaping Use   Vaping status: Never  Used  Substance Use Topics   Alcohol use: Yes    Comment: occasionally   Drug use: Yes    Types: Marijuana     Allergies   Patient has no known allergies.   Review of Systems Review of Systems Per HPI  Physical Exam Triage Vital Signs ED Triage Vitals [03/23/24 0853]  Encounter Vitals Group     BP 139/87     Girls Systolic BP Percentile      Girls Diastolic BP Percentile      Boys Systolic BP Percentile      Boys Diastolic BP Percentile      Pulse Rate 76     Resp 17     Temp 98.1 F (36.7 C)     Temp Source Oral     SpO2 99 %     Weight      Height      Head Circumference      Peak Flow      Pain Score 8     Pain Loc      Pain Education      Exclude from Growth Chart    No data found.  Updated Vital Signs BP 139/87 (BP Location: Left Arm)   Pulse 76   Temp 98.1 F (36.7 C) (Oral)   Resp 17   LMP 03/22/2024 (Exact Date)   SpO2 99%   Visual Acuity Right Eye Distance:   Left Eye Distance:  Bilateral Distance:    Right Eye Near:   Left Eye Near:    Bilateral Near:     Physical Exam Vitals and nursing note reviewed.  Constitutional:      Appearance: She is not ill-appearing or toxic-appearing.  HENT:     Head: Normocephalic and atraumatic.     Right Ear: Hearing and external ear normal.     Left Ear: Hearing and external ear normal.     Nose: Nose normal.     Mouth/Throat:     Lips: Pink.  Eyes:     General: Lids are normal. Vision grossly intact. Gaze aligned appropriately.     Extraocular Movements: Extraocular movements intact.     Conjunctiva/sclera: Conjunctivae normal.  Pulmonary:     Effort: Pulmonary effort is normal.  Abdominal:     General: Bowel sounds are normal.     Palpations: Abdomen is soft.     Tenderness: There is no abdominal tenderness. There is no right CVA tenderness, left CVA tenderness or guarding.  Musculoskeletal:     Cervical back: Neck supple.  Skin:    General: Skin is warm and dry.     Capillary Refill:  Capillary refill takes less than 2 seconds.     Findings: No rash.  Neurological:     General: No focal deficit present.     Mental Status: She is alert and oriented to person, place, and time. Mental status is at baseline.     Cranial Nerves: No dysarthria or facial asymmetry.  Psychiatric:        Mood and Affect: Mood normal.        Speech: Speech normal.        Behavior: Behavior normal.        Thought Content: Thought content normal.        Judgment: Judgment normal.      UC Treatments / Results  Labs (all labs ordered are listed, but only abnormal results are displayed) Labs Reviewed  HIV ANTIBODY (ROUTINE TESTING W REFLEX)  RPR  CERVICOVAGINAL ANCILLARY ONLY    EKG   Radiology No results found.  Procedures Procedures (including critical care time)  Medications Ordered in UC Medications - No data to display  Initial Impression / Assessment and Plan / UC Course  I have reviewed the triage vital signs and the nursing notes.  Pertinent labs & imaging results that were available during my care of the patient were reviewed by me and considered in my medical decision making (see chart for details).   1. Dysmenorrhea, screening for STD Dysmenorrhea will be treated with tylenol/ibuprofen PRN.  We did not repeat urine pregnancy test today as she has had 4 negative tests at home.  STI labs pending, will notify patient of positive results and treat accordingly per protocol when labs result.  Patient would like HIV and syphilis testing today.   Patient to avoid sexual intercourse until screening testing comes back.   Education provided regarding safe sexual practices and patient encouraged to use protection to prevent spread of STIs.    Counseled patient on potential for adverse effects with medications prescribed/recommended today, strict ER and return-to-clinic precautions discussed, patient verbalized understanding.    Final Clinical Impressions(s) / UC Diagnoses    Final diagnoses:  Dysmenorrhea  Screening for STD (sexually transmitted disease)     Discharge Instructions      Take 3 pills of ibuprofen (600mg ) every 6 hours as needed for menstrual cramping.  You may take ibuprofen  in addition to your normal tylenol that you are already taking. Tylenol 1,000mg  every 6 hours as needed for menstrual cramping.   STD testing pending, this will take 2-3 days to result. We will only call you if your testing is positive for any infection(s) and we will provide treatment.  Avoid sexual intercourse until your STD results come back.  If any of your STD results are positive, you will need to avoid sexual intercourse for 7 days while you are being treated to prevent spread of STD.  Condom use is the best way to prevent spread of STDs. Notify partner(s) of any positive results.  Return to urgent care as needed.      ED Prescriptions     Medication Sig Dispense Auth. Provider   ibuprofen (ADVIL) 200 MG tablet Take 3 tablets (600 mg total) by mouth every 6 (six) hours as needed. 30 tablet Enedelia Dorna HERO, FNP      PDMP not reviewed this encounter.   Enedelia Dorna HERO, [place] 03/23/24 [id number]

## 2024-03-24 LAB — CERVICOVAGINAL ANCILLARY ONLY
Chlamydia: NEGATIVE
Comment: NEGATIVE
Comment: NEGATIVE
Comment: NORMAL
Neisseria Gonorrhea: NEGATIVE
Trichomonas: NEGATIVE

## 2024-03-24 LAB — RPR: RPR Ser Ql: NONREACTIVE

## 2024-06-02 ENCOUNTER — Ambulatory Visit (HOSPITAL_COMMUNITY)
Admission: RE | Admit: 2024-06-02 | Discharge: 2024-06-02 | Disposition: A | Discharge status: Home / Self Care | Source: Ambulatory Visit | Attending: Nurse Practitioner | Admitting: Nurse Practitioner

## 2024-06-02 ENCOUNTER — Encounter (HOSPITAL_COMMUNITY): Payer: Self-pay

## 2024-06-02 VITALS — BP 147/79 | HR 84 | Temp 98.3°F | Resp 17

## 2024-06-02 DIAGNOSIS — Z3202 Encounter for pregnancy test, result negative: Secondary | ICD-10-CM

## 2024-06-02 DIAGNOSIS — N939 Abnormal uterine and vaginal bleeding, unspecified: Secondary | ICD-10-CM

## 2024-06-02 LAB — POCT URINE PREGNANCY: Preg Test, Ur: NEGATIVE

## 2024-06-02 NOTE — ED Provider Notes (Signed)
 " [facility]    CSN: [id number] Arrival date & time: 06/02/24  9157      History   Chief Complaint Chief Complaint  Patient presents with   Vaginal Bleeding    Sara Waller and Red mixed together when my cycle came on - Entered by patient    HPI Sara Waller is a 29 y.o. female.   Patient presents today with concern for possible pregnancy.  Reports she had her menstrual cycle on 05/18/2024 and states at first, she noticed brown and red blood mixed.  She has never had this happen before and became concerned.  She googled it and saw that it could be an early pregnancy.  Reports she continued to have a normal menstrual cycle after and it has fully resolved.  She denies any abnormal vaginal discharge since then, vaginal odor, itching, vaginal rashes, sores, lesions, pelvic pain, fever, nausea/vomiting, or groin swelling.  No concern for exposures to STI.  Reports she was recently treated for STI and was negative.  She denies any early symptoms of pregnancy today including no breast tenderness, mood changes, food aversions.    History reviewed. No pertinent past medical history.  There are no active problems to display for this patient.   History reviewed. No pertinent surgical history.  OB History   No obstetric history on file.      Home Medications    Prior to Admission medications  Medication Sig Start Date End Date Taking? Authorizing Provider  albuterol  (PROVENTIL  HFA;VENTOLIN  HFA) 108 (90 Base) MCG/ACT inhaler Inhale 2 puffs into the lungs every 6 (six) hours as needed for wheezing or shortness of breath. 07/02/16   Arloa Suzen RAMAN, NP    Family History Family History  Problem Relation Age of Onset   Healthy Mother     Social History Social History[1]   Allergies   Patient has no known allergies.   Review of Systems Review of Systems Per HPI  Physical Exam Triage Vital Signs ED Triage Vitals  Encounter Vitals Group     BP 06/02/24 0908 (!)  147/79     Girls Systolic BP Percentile --      Girls Diastolic BP Percentile --      Boys Systolic BP Percentile --      Boys Diastolic BP Percentile --      Pulse Rate 06/02/24 0908 84     Resp 06/02/24 0908 17     Temp 06/02/24 0908 98.3 F (36.8 C)     Temp Source 06/02/24 0908 Oral     SpO2 06/02/24 0908 98 %     Weight --      Height --      Head Circumference --      Peak Flow --      Pain Score 06/02/24 0907 0     Pain Loc --      Pain Education --      Exclude from Growth Chart --    No data found.  Updated Vital Signs BP (!) 147/79 (BP Location: Right Arm)   Pulse 84   Temp 98.3 F (36.8 C) (Oral)   Resp 17   LMP 05/18/2024 (Exact Date)   SpO2 98%   Visual Acuity Right Eye Distance:   Left Eye Distance:   Bilateral Distance:    Right Eye Near:   Left Eye Near:    Bilateral Near:     Physical Exam Vitals and nursing note reviewed.  Constitutional:  General: She is not in acute distress.    Appearance: Normal appearance. She is not toxic-appearing.  Pulmonary:     Effort: Pulmonary effort is normal. No respiratory distress.  Skin:    General: Skin is warm and dry.     Coloration: Skin is not jaundiced or pale.     Findings: No erythema.  Neurological:     Mental Status: She is alert and oriented to person, place, and time.     Motor: No weakness.     Gait: Gait normal.  Psychiatric:        Mood and Affect: Mood normal.        Behavior: Behavior is cooperative.      UC Treatments / Results  Labs (all labs ordered are listed, but only abnormal results are displayed) Labs Reviewed  POCT URINE PREGNANCY    EKG   Radiology No results found.  Procedures Procedures (including critical care time)  Medications Ordered in UC Medications - No data to display  Initial Impression / Assessment and Plan / UC Course  I have reviewed the triage vital signs and the nursing notes.  Pertinent labs & imaging results that were available during  my care of the patient were reviewed by me and considered in my medical decision making (see chart for details).   Patient is a pleasant, well-appearing 29 year old female presenting today for vaginal bleeding and concern for pregnancy.  Vital signs are stable and exam is reassuring.  Pregnancy test is negative.  Discussed results with patient.  Discussed that menstrual cycle can include some brown blood and red blood.  She is asymptomatic of STIs, has no concern for exposures to STIs and therefore testing was deferred.    The patient was given the opportunity to ask questions.  All questions answered to their satisfaction.  The patient is in agreement to this plan.   Final Clinical Impressions(s) / UC Diagnoses   Final diagnoses:  Vaginal bleeding  Urine pregnancy test negative     Discharge Instructions      The urine pregnancy test is negative today.  The mixed brown/red discharge around your menstrual cycle is your menstrual cycle.  Please seek care if you develop any new symptoms.    ED Prescriptions   None    PDMP not reviewed this encounter.    [1]  Social History Tobacco Use   Smoking status: Former    Types: Cigarettes   Smokeless tobacco: Never  Vaping Use   Vaping status: Never Used  Substance Use Topics   Alcohol use: Yes    Comment: occasionally   Drug use: Yes    Types: Marijuana     Chandra Harlene LABOR, NP 06/02/24 1109  "

## 2024-06-02 NOTE — ED Triage Notes (Signed)
 Pt reports her menstrual cycle came on 05/18/24. Reports noticed her blood was mixed of red and brown blood. Reports that her brown stopped then was normal red. Red online that could be pregnancy sign. Reports she had negative pregnancy tests at home. Reports her cycle was normal amount of days.

## 2024-06-02 NOTE — Discharge Instructions (Signed)
 The urine pregnancy test is negative today.  The mixed brown/red discharge around your menstrual cycle is your menstrual cycle.  Please seek care if you develop any new symptoms.
